# Patient Record
Sex: Male | Born: 2005 | Race: White | Hispanic: No | Marital: Single | Smoking: Never smoker
Health system: Southern US, Community
[De-identification: ages and names within clinical notes are randomized; demographics above are authoritative.]

## PROBLEM LIST (undated history)

## (undated) HISTORY — PX: OTHER SURGICAL HISTORY: SHX169

## (undated) HISTORY — PX: TONSILLECTOMY: SUR1361

---
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
## 2023-01-16 ENCOUNTER — Ambulatory Visit (INDEPENDENT_AMBULATORY_CARE_PROVIDER_SITE_OTHER): Payer: 59 | Admitting: Radiology

## 2023-01-16 ENCOUNTER — Ambulatory Visit
Admission: RE | Admit: 2023-01-16 | Discharge: 2023-01-16 | Disposition: A | Discharge status: Home / Self Care | Payer: 59 | Source: Ambulatory Visit | Attending: Internal Medicine | Admitting: Internal Medicine

## 2023-01-16 VITALS — BP 119/49 | HR 104 | Temp 100.1°F | Resp 17

## 2023-01-16 DIAGNOSIS — R509 Fever, unspecified: Secondary | ICD-10-CM

## 2023-01-16 DIAGNOSIS — R052 Subacute cough: Secondary | ICD-10-CM

## 2023-01-16 DIAGNOSIS — R112 Nausea with vomiting, unspecified: Secondary | ICD-10-CM | POA: Diagnosis not present

## 2023-01-16 DIAGNOSIS — J101 Influenza due to other identified influenza virus with other respiratory manifestations: Secondary | ICD-10-CM

## 2023-01-16 LAB — POCT INFLUENZA A/B
Influenza A, POC: POSITIVE — AB
Influenza B, POC: NEGATIVE

## 2023-01-16 MED ORDER — GUAIFENESIN ER 1200 MG PO TB12: 1200.0000 mg | ORAL_TABLET | Freq: Two times a day (BID) | ORAL | 0 refills | Status: AC

## 2023-01-16 MED ORDER — ONDANSETRON 4 MG PO TBDP: 4.0000 mg | ORAL_TABLET | Freq: Three times a day (TID) | ORAL | 0 refills | Status: AC | PRN

## 2023-01-16 MED ORDER — PROMETHAZINE-DM 6.25-15 MG/5ML PO SYRP: 5.0000 mL | ORAL_SOLUTION | Freq: Every evening | ORAL | 0 refills | Status: AC | PRN

## 2023-01-16 MED ORDER — KETOROLAC TROMETHAMINE 30 MG/ML IJ SOLN
30.0000 mg | Freq: Once | INTRAMUSCULAR | Status: AC
  Administered 2023-01-16: 30 mg via INTRAMUSCULAR

## 2023-01-16 MED ORDER — ONDANSETRON 4 MG PO TBDP
4.0000 mg | ORAL_TABLET | Freq: Once | ORAL | Status: AC
  Administered 2023-01-16: 4 mg via ORAL

## 2023-01-16 MED ORDER — OSELTAMIVIR PHOSPHATE 75 MG PO CAPS: 75.0000 mg | ORAL_CAPSULE | Freq: Two times a day (BID) | ORAL | 0 refills | Status: AC

## 2023-01-16 MED ORDER — ACETAMINOPHEN 325 MG PO TABS
975.0000 mg | ORAL_TABLET | Freq: Once | ORAL | Status: AC
  Administered 2023-01-16: 975 mg via ORAL

## 2023-01-16 NOTE — ED Triage Notes (Addendum)
 Pt c/o fever, cough, vomiting, and congestion. States he was initially sick around thanksgiving and given a z pac. He got somewhat better but symptoms began to feel worse.   States he threw up 13 times last night. He has lost 40lbs in last few months due to vomiting.

## 2023-01-16 NOTE — ED Provider Notes (Signed)
 [facility]    CSN: [id number] Arrival date & time: 01/16/23  0815      History   Chief Complaint Chief Complaint  Patient presents with   Fever    Entered by patient    HPI Raymond Mccoy is a 18 y.o. male.   Raymond Mccoy is a 18 y.o. male presenting for chief complaint of cough, fever, chills, body aches, and nasal congestion that started yesterday January 15, 2023.  Max temp at home was 104 last night.  Additionally reports nausea with multiple episodes of nonbilious/nonbloody emesis starting yesterday and into last night.  Currently nauseous.  He goes to college at [employer] and states multiple classmates have been sick with similar symptoms.  No history of chronic respiratory problems.  Vapes nicotine substance daily, otherwise no drug use.  He took Tylenol  yesterday but has not had any antipyretics or antiemetics this morning and is currently with low-grade fever at 100.1.   Of note, patient became ill 6 weeks ago with cough, congestion, and persistent fever.  He was treated with azithromycin  antibiotic after 1 to 2 weeks of symptoms and persistent fevers.  He states his symptoms improved after azithromycin  but never fully went away.  He developed shortness of breath yesterday along with cough.  No chest tightness, leg swelling, orthopnea, dizziness, ear pain, headaches.  No recent steroids or antibiotics.  Chest x-ray was not performed when he went to urgent care for the antibiotic 2 to 3 weeks ago.   Fever   History reviewed. No pertinent past medical history.  There are no active problems to display for this patient.   Past Surgical History:  Procedure Laterality Date   eustacian tubes     TONSILLECTOMY         Home Medications    Prior to Admission medications   Medication Sig Start Date End Date Taking? Authorizing Provider  Guaifenesin  1200 MG TB12 Take 1 tablet (1,200 mg total) by mouth in the morning and at bedtime.  01/16/23  Yes Enedelia Dorna HERO, FNP  ondansetron  (ZOFRAN -ODT) 4 MG disintegrating tablet Take 1 tablet (4 mg total) by mouth every 8 (eight) hours as needed for nausea or vomiting. 01/16/23  Yes Enedelia Dorna HERO, FNP  oseltamivir  (TAMIFLU ) 75 MG capsule Take 1 capsule (75 mg total) by mouth every 12 (twelve) hours. 01/16/23  Yes Enedelia Dorna HERO, FNP  promethazine -dextromethorphan (PROMETHAZINE -DM) 6.25-15 MG/5ML syrup Take 5 mLs by mouth at bedtime as needed for cough. 01/16/23  Yes Enedelia Dorna HERO, FNP  fexofenadine-pseudoephedrine (ALLEGRA-D) 60-120 MG 12 hr tablet Take 1 tablet by mouth 2 (two) times daily.    [provider]    Family History History reviewed. No pertinent family history.  Social History Social History   Tobacco Use   Smoking status: Never   Smokeless tobacco: Never  Vaping Use   Vaping status: Former  Substance Use Topics   Alcohol use: No   Drug use: No     Allergies   Patient has no known allergies.   Review of Systems Review of Systems  Constitutional:  Positive for fever.  Per HPI   Physical Exam Triage Vital Signs ED Triage Vitals [01/16/23 0838]  Encounter Vitals Group     BP (!) 119/49     Systolic BP Percentile      Diastolic BP Percentile      Pulse Rate (!) 104     Resp 17  Temp 100.1 F (37.8 C)     Temp Source Oral     SpO2 97 %     Weight      Height      Head Circumference      Peak Flow      Pain Score 8     Pain Loc      Pain Education      Exclude from Growth Chart    No data found.  Updated Vital Signs BP (!) 119/49 (BP Location: Right Arm)   Pulse (!) 104   Temp 100.1 F (37.8 C) (Oral)   Resp 17   SpO2 97%   Visual Acuity Right Eye Distance:   Left Eye Distance:   Bilateral Distance:    Right Eye Near:   Left Eye Near:    Bilateral Near:     Physical Exam Vitals and nursing note reviewed.  Constitutional:      Appearance: He is ill-appearing. He is not toxic-appearing.   HENT:     Head: Normocephalic and atraumatic.     Right Ear: Hearing, tympanic membrane, ear canal and external ear normal.     Left Ear: Hearing, tympanic membrane, ear canal and external ear normal.     Nose: Congestion present.     Mouth/Throat:     Lips: Pink.     Mouth: Mucous membranes are moist. No injury or oral lesions.     Dentition: Normal dentition.     Tongue: No lesions.     Pharynx: Oropharynx is clear. Uvula midline. Posterior oropharyngeal erythema present. No pharyngeal swelling, oropharyngeal exudate, uvula swelling or postnasal drip.     Tonsils: No tonsillar exudate.  Eyes:     General: Lids are normal. Vision grossly intact. Gaze aligned appropriately.     Extraocular Movements: Extraocular movements intact.     Conjunctiva/sclera: Conjunctivae normal.  Neck:     Trachea: Trachea and phonation normal.  Cardiovascular:     Rate and Rhythm: Normal rate and regular rhythm.     Heart sounds: Normal heart sounds, S1 normal and S2 normal.  Pulmonary:     Effort: Pulmonary effort is normal. No respiratory distress.     Breath sounds: Normal air entry. Wheezing present. No rhonchi or rales.     Comments: Faint low pitched expiratory heard to the right lower lung field.  Otherwise normal breath sounds throughout.  Harsh productive cough elicited with deep inspiration.  Speaking in full sentences without difficulty. Chest:     Chest wall: No tenderness.  Musculoskeletal:     Cervical back: Neck supple.  Lymphadenopathy:     Cervical: No cervical adenopathy.  Skin:    General: Skin is warm and dry.     Capillary Refill: Capillary refill takes less than 2 seconds.     Findings: No rash.  Neurological:     General: No focal deficit present.     Mental Status: He is alert and oriented to person, place, and time. Mental status is at baseline.     Cranial Nerves: No dysarthria or facial asymmetry.  Psychiatric:        Mood and Affect: Mood normal.        Speech:  Speech normal.        Behavior: Behavior normal.        Thought Content: Thought content normal.        Judgment: Judgment normal.      UC Treatments / Results  Labs (all labs ordered  are listed, but only abnormal results are displayed) Labs Reviewed  POCT INFLUENZA A/B - Abnormal; Notable for the following components:      Result Value   Influenza A, POC Positive (*)    All other components within normal limits    EKG   Radiology DG Chest 2 View Result Date: 01/16/2023 CLINICAL DATA:  persistent cough for 2-3 weeks, fever, shortness of breath EXAM: CHEST - 2 VIEW COMPARISON:  03/02/2016 FINDINGS: The heart size and mediastinal contours are within normal limits. Both lungs are clear. The visualized skeletal structures are unremarkable. IMPRESSION: No active cardiopulmonary disease. Electronically Signed   By: CHRISTELLA.  Shick M.D.   On: 01/16/2023 09:35    Procedures Procedures (including critical care time)  Medications Ordered in UC Medications  ondansetron  (ZOFRAN -ODT) disintegrating tablet 4 mg (4 mg Oral Given 01/16/23 0852)  ketorolac  (TORADOL ) 30 MG/ML injection 30 mg (30 mg Intramuscular Given 01/16/23 0934)  acetaminophen  (TYLENOL ) tablet 975 mg (975 mg Oral Given 01/16/23 0933)    Initial Impression / Assessment and Plan / UC Course  I have reviewed the triage vital signs and the nursing notes.  Pertinent labs & imaging results that were available during my care of the patient were reviewed by me and considered in my medical decision making (see chart for details).   1. Fever, nausea and vomiting, subacute cough, influenza A Flu A point of care testing positive. Lungs clear, vitals hemodynamically stable. Chest x-ray due to subacute cough and concern for acute illness versus progressive post-viral bacterial pneumonia. Chest x-ray negative for pneumonia/abnormality.  Interventions in clinic: Zofran  4mg  ODT, ketorolac  30mg  IM, tylenol  PO No NSAIDs for 24 hours after  ketorolac .  Offered antiviral given timing of illness, Tamiflu  sent to pharmacy.  Recommend supportive care for further symptomatic relief as outlined in AVS.  Modes of transmission, quarantine recommendations, and hand hygiene discussed.  Counseled patient on potential for adverse effects with medications prescribed/recommended today, strict ER and return-to-clinic precautions discussed, patient verbalized understanding.    Final Clinical Impressions(s) / UC Diagnoses   Final diagnoses:  Subacute cough  Fever, unspecified  Nausea and vomiting, unspecified vomiting type  Influenza A     Discharge Instructions      You have the flu.  Give Tamiflu  as prescribed every 12 hours for the next 5 days. Zofran  every 8 hours as needed for nausea and vomiting. Tylenol  and ibuprofen  as needed for fever and aches/pains. Promethazine  DM at bedtime as needed for cough. Mucinex  every 12 hours for cough and congestion.  You may go back to school once you have been fever free without tylenol /motrin  for 24 hours.  If you develop any new or worsening symptoms or if your symptoms do not start to improve, please return here or follow-up with your primary care provider. If your symptoms are severe, please go to the emergency room.      ED Prescriptions     Medication Sig Dispense Auth. Provider   promethazine -dextromethorphan (PROMETHAZINE -DM) 6.25-15 MG/5ML syrup Take 5 mLs by mouth at bedtime as needed for cough. 118 mL Enedelia Going M, FNP   Guaifenesin  1200 MG TB12 Take 1 tablet (1,200 mg total) by mouth in the morning and at bedtime. 14 tablet Enedelia Going CHRISTELLA, FNP   oseltamivir  (TAMIFLU ) 75 MG capsule Take 1 capsule (75 mg total) by mouth every 12 (twelve) hours. 10 capsule Enedelia Going CHRISTELLA, FNP   ondansetron  (ZOFRAN -ODT) 4 MG disintegrating tablet Take 1 tablet (4 mg total) by mouth  every 8 (eight) hours as needed for nausea or vomiting. 20 tablet Enedelia Dorna HERO, FNP       PDMP not reviewed this encounter.   Enedelia Dorna HERO, FNP 01/16/23 1027

## 2023-01-16 NOTE — Discharge Instructions (Addendum)
 You have the flu.  Give Tamiflu  as prescribed every 12 hours for the next 5 days. Zofran  every 8 hours as needed for nausea and vomiting. Tylenol  and ibuprofen  as needed for fever and aches/pains. Promethazine  DM at bedtime as needed for cough. Mucinex  every 12 hours for cough and congestion.  You may go back to school once you have been fever free without tylenol /motrin  for 24 hours.  If you develop any new or worsening symptoms or if your symptoms do not start to improve, please return here or follow-up with your primary care provider. If your symptoms are severe, please go to the emergency room.

## 2023-01-18 ENCOUNTER — Ambulatory Visit: Payer: Self-pay

## 2023-01-19 ENCOUNTER — Ambulatory Visit
Admission: RE | Admit: 2023-01-19 | Discharge: 2023-01-19 | Disposition: A | Discharge status: Home / Self Care | Payer: 59 | Source: Ambulatory Visit | Attending: Internal Medicine

## 2023-01-19 VITALS — BP 130/73 | HR 99 | Temp 98.3°F | Resp 16

## 2023-01-19 DIAGNOSIS — H6692 Otitis media, unspecified, left ear: Secondary | ICD-10-CM

## 2023-01-19 DIAGNOSIS — J209 Acute bronchitis, unspecified: Secondary | ICD-10-CM

## 2023-01-19 MED ORDER — ALBUTEROL SULFATE HFA 108 (90 BASE) MCG/ACT IN AERS: 1.0000 | INHALATION_SPRAY | Freq: Four times a day (QID) | RESPIRATORY_TRACT | 0 refills | Status: AC | PRN

## 2023-01-19 MED ORDER — PREDNISONE 20 MG PO TABS: 40.0000 mg | ORAL_TABLET | Freq: Every day | ORAL | 0 refills | Status: AC

## 2023-01-19 MED ORDER — AMOXICILLIN-POT CLAVULANATE 875-125 MG PO TABS: 1.0000 | ORAL_TABLET | Freq: Two times a day (BID) | ORAL | 0 refills | Status: AC

## 2023-01-19 NOTE — ED Triage Notes (Signed)
Pt c/o left ear pain. Pt was her a few days ago and dx with flu a

## 2023-01-19 NOTE — Discharge Instructions (Addendum)
You have bronchitis which is inflammation of the upper airways in your lungs due to a virus. The following medicines will help with your symptoms.   - Take Augmentin antibiotic every 12 hours for the next 7 days to treat left ear infection.  - Take steroid pills sent to pharmacy as directed. Do not take any other NSAID containing medications such as ibuprofen or naproxen/Aleve while taking prednisone. - You may use albuterol inhaler 1 to 2 puffs every 4-6 hours as needed for cough, shortness of breath, and wheezing. - Take cough medicines as needed. - Use mucinex to break up congestion in nose/chest (guaifenesin 600-1,200mg  every 12 hours as needed).   If you develop any new or worsening symptoms or do not improve in the next 2 to 3 days, please return.  If your symptoms are severe, please go to the emergency room. Follow-up with PCP as needed.

## 2023-01-19 NOTE — ED Provider Notes (Signed)
[facility]    CSN: [id number] Arrival date & time: 01/19/23  1015      History   Chief Complaint Chief Complaint  Patient presents with   Follow-up    Follow up from flu diagnosis on Tuesday. Experiencing ear pain. - Entered by patient    HPI Raymond Mccoy is a 18 y.o. male.   Patient presents to urgent care for evaluation of left-sided ear pain that started yesterday.  Patient became sick with cough, congestion, fever, chills, nausea, vomiting, and bodyaches 5 days ago on January 15, 2023.  He was seen at urgent care on January 14 where he tested positive for influenza and was prescribed Tamiflu.  He has been taking Tamiflu, Zofran, ibuprofen, and Tylenol for symptomatic relief.  He is no longer vomiting and reports improvement in cough/body aches.  He developed shortness of breath 2 days ago and started using expired albuterol inhaler with some relief.  Left ear pain started yesterday.  Additionally reports persistent fever up to 101.0 last night, responded well to Tylenol use at home.  Denies chest pain, dizziness, ear drainage, and vision changes.       History reviewed. No pertinent past medical history.  There are no active problems to display for this patient.   Past Surgical History:  Procedure Laterality Date   eustacian tubes     TONSILLECTOMY         Home Medications    Prior to Admission medications   Medication Sig Start Date End Date Taking? Authorizing Provider  albuterol (VENTOLIN HFA) 108 (90 Base) MCG/ACT inhaler Inhale 1-2 puffs into the lungs every 6 (six) hours as needed for wheezing or shortness of breath. 01/19/23  Yes Carlisle Beers, FNP  amoxicillin-clavulanate (AUGMENTIN) 875-125 MG tablet Take 1 tablet by mouth every 12 (twelve) hours. 01/19/23  Yes Carlisle Beers, FNP  predniSONE (DELTASONE) 20 MG tablet Take 2 tablets (40 mg total) by mouth daily with breakfast for 5 days. 01/19/23 01/24/23 Yes Merranda Bolls, Donavan Burnet, FNP  fexofenadine-pseudoephedrine (ALLEGRA-D) 60-120 MG 12 hr tablet Take 1 tablet by mouth 2 (two) times daily.    [provider]  Guaifenesin 1200 MG TB12 Take 1 tablet (1,200 mg total) by mouth in the morning and at bedtime. 01/16/23   Carlisle Beers, FNP  ondansetron (ZOFRAN-ODT) 4 MG disintegrating tablet Take 1 tablet (4 mg total) by mouth every 8 (eight) hours as needed for nausea or vomiting. 01/16/23   Carlisle Beers, FNP  oseltamivir (TAMIFLU) 75 MG capsule Take 1 capsule (75 mg total) by mouth every 12 (twelve) hours. 01/16/23   Carlisle Beers, FNP  promethazine-dextromethorphan (PROMETHAZINE-DM) 6.25-15 MG/5ML syrup Take 5 mLs by mouth at bedtime as needed for cough. 01/16/23   Carlisle Beers, FNP    Family History History reviewed. No pertinent family history.  Social History Social History   Tobacco Use   Smoking status: Never   Smokeless tobacco: Never  Vaping Use   Vaping status: Former  Substance Use Topics   Alcohol use: No   Drug use: No     Allergies   Patient has no known allergies.   Review of Systems Review of Systems Per HPI  Physical Exam Triage Vital Signs ED Triage Vitals  Encounter Vitals Group     BP 01/19/23 1027 130/73     Systolic BP Percentile --      Diastolic BP Percentile --      Pulse Rate 01/19/23  1027 99     Resp 01/19/23 1027 16     Temp 01/19/23 1027 98.3 F (36.8 C)     Temp Source 01/19/23 1027 Oral     SpO2 01/19/23 1027 96 %     Weight --      Height --      Head Circumference --      Peak Flow --      Pain Score 01/19/23 1029 5     Pain Loc --      Pain Education --      Exclude from Growth Chart --    No data found.  Updated Vital Signs BP 130/73 (BP Location: Right Arm)   Pulse 99   Temp 98.3 F (36.8 C) (Oral)   Resp 16   SpO2 96%   Visual Acuity Right Eye Distance:   Left Eye Distance:   Bilateral Distance:    Right Eye Near:   Left Eye Near:    Bilateral  Near:     Physical Exam Vitals and nursing note reviewed.  Constitutional:      Appearance: He is not ill-appearing or toxic-appearing.  HENT:     Head: Normocephalic and atraumatic.     Jaw: There is normal jaw occlusion.     Right Ear: Hearing, ear canal and external ear normal. Tympanic membrane is erythematous. Tympanic membrane is not perforated or bulging.     Left Ear: Hearing and external ear normal. Swelling and tenderness present. Tympanic membrane is erythematous and bulging. Tympanic membrane is not perforated.     Nose: Nose normal.     Mouth/Throat:     Lips: Pink.     Mouth: Mucous membranes are moist. No injury or oral lesions.     Dentition: Normal dentition.     Tongue: No lesions.     Pharynx: Oropharynx is clear. Uvula midline. No pharyngeal swelling, oropharyngeal exudate, posterior oropharyngeal erythema, uvula swelling or postnasal drip.     Tonsils: No tonsillar exudate.  Eyes:     General: Lids are normal. Vision grossly intact. Gaze aligned appropriately.     Extraocular Movements: Extraocular movements intact.     Conjunctiva/sclera: Conjunctivae normal.  Neck:     Trachea: Trachea and phonation normal.  Cardiovascular:     Rate and Rhythm: Normal rate and regular rhythm.     Heart sounds: Normal heart sounds, S1 normal and S2 normal.  Pulmonary:     Effort: Pulmonary effort is normal. No respiratory distress.     Breath sounds: Normal air entry. No stridor. Wheezing (Focal expiratory wheeze heard to the right lower lung field with coarse breath sounds throughout.) present. No rhonchi or rales.     Comments: Speaking in full sentences without difficulty. Chest:     Chest wall: No tenderness.  Musculoskeletal:     Cervical back: Neck supple.  Lymphadenopathy:     Cervical: Cervical adenopathy present.  Skin:    General: Skin is warm and dry.     Capillary Refill: Capillary refill takes less than 2 seconds.     Findings: No rash.  Neurological:      General: No focal deficit present.     Mental Status: He is alert and oriented to person, place, and time. Mental status is at baseline.     Cranial Nerves: No dysarthria or facial asymmetry.  Psychiatric:        Mood and Affect: Mood normal.        Speech: Speech normal.  Behavior: Behavior normal.        Thought Content: Thought content normal.        Judgment: Judgment normal.      UC Treatments / Results  Labs (all labs ordered are listed, but only abnormal results are displayed) Labs Reviewed - No data to display  EKG   Radiology No results found.  Procedures Procedures (including critical care time)  Medications Ordered in UC Medications - No data to display  Initial Impression / Assessment and Plan / UC Course  I have reviewed the triage vital signs and the nursing notes.  Pertinent labs & imaging results that were available during my care of the patient were reviewed by me and considered in my medical decision making (see chart for details).   1.  Left otitis media, acute bronchitis Presentation consistent with acute viral bronchitis. Will treat with steroid, bronchodilator, cough suppressants for symptomatic relief, and expectorants (mucinex) as needed- see AVS.   Patient non-toxic in appearance, vital signs hemodynamically stable, no new oxygen requirement, therefore deferred imaging of the chest. Low suspicion for acute cardiopulmonary abnormality based on history and PE.  Left AOM will be treated with Augmentin twice daily for 7 days given recent antibiotic use within the last 90 days.  Counseled patient on potential for adverse effects with medications prescribed/recommended today, strict ER and return-to-clinic precautions discussed, patient verbalized understanding.    Final Clinical Impressions(s) / UC Diagnoses   Final diagnoses:  Left otitis media, unspecified otitis media type  Acute bronchitis, unspecified organism     Discharge  Instructions      You have bronchitis which is inflammation of the upper airways in your lungs due to a virus. The following medicines will help with your symptoms.   - Take Augmentin antibiotic every 12 hours for the next 7 days to treat left ear infection.  - Take steroid pills sent to pharmacy as directed. Do not take any other NSAID containing medications such as ibuprofen or naproxen/Aleve while taking prednisone. - You may use albuterol inhaler 1 to 2 puffs every 4-6 hours as needed for cough, shortness of breath, and wheezing. - Take cough medicines as needed. - Use mucinex to break up congestion in nose/chest (guaifenesin 600-1,200mg  every 12 hours as needed).   If you develop any new or worsening symptoms or do not improve in the next 2 to 3 days, please return.  If your symptoms are severe, please go to the emergency room. Follow-up with PCP as needed.    ED Prescriptions     Medication Sig Dispense Auth. Provider   predniSONE (DELTASONE) 20 MG tablet Take 2 tablets (40 mg total) by mouth daily with breakfast for 5 days. 10 tablet Carlisle Beers, FNP   amoxicillin-clavulanate (AUGMENTIN) 875-125 MG tablet Take 1 tablet by mouth every 12 (twelve) hours. 14 tablet Carlisle Beers, FNP   albuterol (VENTOLIN HFA) 108 (90 Base) MCG/ACT inhaler Inhale 1-2 puffs into the lungs every 6 (six) hours as needed for wheezing or shortness of breath. 8 g Carlisle Beers, FNP      PDMP not reviewed this encounter.   Carlisle Beers, FNP 01/19/23 1051

## 2023-05-16 ENCOUNTER — Emergency Department
Admission: EM | Admit: 2023-05-16 | Discharge: 2023-05-16 | Disposition: A | Discharge status: Home / Self Care | Attending: Emergency Medicine | Admitting: Emergency Medicine

## 2023-05-16 ENCOUNTER — Encounter: Payer: Self-pay | Admitting: Emergency Medicine

## 2023-05-16 ENCOUNTER — Other Ambulatory Visit: Payer: Self-pay

## 2023-05-16 DIAGNOSIS — W44D0XA Magnetic metal object unspecified, entering into or through a natural orifice, initial encounter: Secondary | ICD-10-CM | POA: Insufficient documentation

## 2023-05-16 DIAGNOSIS — Y990 Civilian activity done for income or pay: Secondary | ICD-10-CM | POA: Insufficient documentation

## 2023-05-16 DIAGNOSIS — T1502XA Foreign body in cornea, left eye, initial encounter: Secondary | ICD-10-CM | POA: Insufficient documentation

## 2023-05-16 DIAGNOSIS — S00259A Superficial foreign body of unspecified eyelid and periocular area, initial encounter: Secondary | ICD-10-CM

## 2023-05-16 DIAGNOSIS — S0592XA Unspecified injury of left eye and orbit, initial encounter: Secondary | ICD-10-CM | POA: Diagnosis present

## 2023-05-16 MED ORDER — TETRACAINE HCL 0.5 % OP SOLN
2.0000 [drp] | Freq: Once | OPHTHALMIC | Status: AC
  Administered 2023-05-16: 2 [drp] via OPHTHALMIC
  Filled 2023-05-16: qty 4

## 2023-05-16 MED ORDER — IBUPROFEN 600 MG PO TABS
600.0000 mg | ORAL_TABLET | Freq: Once | ORAL | Status: AC
  Administered 2023-05-16: 600 mg via ORAL
  Filled 2023-05-16: qty 1

## 2023-05-16 MED ORDER — FLUORESCEIN SODIUM 1 MG OP STRP
1.0000 | ORAL_STRIP | Freq: Once | OPHTHALMIC | Status: AC
  Administered 2023-05-16: 1 via OPHTHALMIC
  Filled 2023-05-16: qty 1

## 2023-05-16 MED ORDER — HYDROCODONE-ACETAMINOPHEN 5-325 MG PO TABS
1.0000 | ORAL_TABLET | Freq: Once | ORAL | Status: AC
  Administered 2023-05-16: 1 via ORAL
  Filled 2023-05-16: qty 1

## 2023-05-16 NOTE — ED Provider Notes (Signed)
 [facility] Provider Note    Event Date/Time   First MD Initiated Contact with Patient 05/16/23 [phone]     (approximate)   History   Foreign Body in Eye   HPI  Raymond Mccoy is a 18 y.o. male who presents to the ED from home with his mother with a chief complaint of foreign body in left eye.  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  He was wearing eye protection at work around 11 AM when he felt something going into his left eye.  Presents with pain and irritation to his left eye.  Does not wear corrective lenses.     Past Medical History  History reviewed. No pertinent past medical history.   Active Problem List  There are no active problems to display for this patient.    Past Surgical History   Past Surgical History:  Procedure Laterality Date   eustacian tubes     TONSILLECTOMY       Home Medications   Prior to Admission medications   Medication Sig Start Date End Date Taking? Authorizing Provider  albuterol  (VENTOLIN  HFA) 108 (90 Base) MCG/ACT inhaler Inhale 1-2 puffs into the lungs every 6 (six) hours as needed for wheezing or shortness of breath. 01/19/23   Starlene Eaton, FNP  amoxicillin -clavulanate (AUGMENTIN ) 875-125 MG tablet Take 1 tablet by mouth every 12 (twelve) hours. 01/19/23   Starlene Eaton, FNP  fexofenadine-pseudoephedrine (ALLEGRA-D) 60-120 MG 12 hr tablet Take 1 tablet by mouth 2 (two) times daily.    [provider]  Guaifenesin  1200 MG TB12 Take 1 tablet (1,200 mg total) by mouth in the morning and at bedtime. 01/16/23   Starlene Eaton, FNP  ondansetron  (ZOFRAN -ODT) 4 MG disintegrating tablet Take 1 tablet (4 mg total) by mouth every 8 (eight) hours as needed for nausea or vomiting. 01/16/23   Starlene Eaton, FNP  oseltamivir  (TAMIFLU ) 75 MG capsule Take 1 capsule (75 mg total) by mouth every 12 (twelve) hours. 01/16/23   Starlene Eaton, FNP   promethazine -dextromethorphan (PROMETHAZINE -DM) 6.25-15 MG/5ML syrup Take 5 mLs by mouth at bedtime as needed for cough. 01/16/23   Starlene Eaton, FNP     Allergies  Patient has no known allergies.   Family History  History reviewed. No pertinent family history.   Physical Exam  Triage Vital Signs: ED Triage Vitals  Encounter Vitals Group     BP 05/16/23 0052 124/78     Systolic BP Percentile --      Diastolic BP Percentile --      Pulse Rate 05/16/23 0052 76     Resp 05/16/23 0052 18     Temp 05/16/23 0052 98.2 F (36.8 C)     Temp Source 05/16/23 0052 Oral     SpO2 05/16/23 0052 99 %     Weight 05/16/23 0050 170 lb (77.1 kg)     Height 05/16/23 0050 6' (1.829 m)     Head Circumference --      Peak Flow --      Pain Score 05/16/23 0050 6     Pain Loc --      Pain Education --      Exclude from Growth Chart --     Updated Vital Signs: BP 122/83 (BP Location: Left Arm)   Pulse 94   Temp 98.2 F (36.8 C) (Oral)   Resp 18   Ht 6' (1.829 m)  Wt 77.1 kg   SpO2 99%   BMI 23.06 kg/m    General: Awake, no distress.  CV:  Good peripheral perfusion.  Resp:  Normal effort.  Abd:  No distention.  Other:  VA: OD 20/40 OS 20/30 left conjunctiva injected.  Tiny black speck noted to upper left quadrant adjacent to cornea.  Tetracaine drops applied and left eye stained with fluorescein strip and examined under Woods lamp: No COA.   ED Results / Procedures / Treatments  Labs (all labs ordered are listed, but only abnormal results are displayed) Labs Reviewed - No data to display   EKG  None   RADIOLOGY None   Official radiology report(s): No results found.   PROCEDURES:  Critical Care performed: No  Procedures   MEDICATIONS ORDERED IN ED: Medications  fluorescein ophthalmic strip 1 strip (1 strip Left Eye Given 05/16/23 0114)  tetracaine (PONTOCAINE) 0.5 % ophthalmic solution 2 drop (2 drops Left Eye Given 05/16/23 0115)   HYDROcodone-acetaminophen  (NORCO/VICODIN) 5-325 MG per tablet 1 tablet (1 tablet Oral Given 05/16/23 0138)  ibuprofen (ADVIL) tablet 600 mg (600 mg Oral Given 05/16/23 0138)     IMPRESSION / MDM / ASSESSMENT AND PLAN / ED COURSE  I reviewed the triage vital signs and the nursing notes.                             18 year old male presenting with metal to left eye.  Metal piece is a tiny speck, flattened against the cornea, unable to be safely removed in the emergency department.  Will refer to ophthalmology today for removal.  Patient stepfather works in IT at [employer] [facility]; mother will also reach out there and patient will be seen wherever he can be seen the soonest.  Strict return precautions given.  Patient and mother verbalized understanding agree with plan of care.  Patient's presentation is most consistent with acute, uncomplicated illness.    FINAL CLINICAL IMPRESSION(S) / ED DIAGNOSES   Final diagnoses:  Metal foreign body in eye region     Rx / DC Orders   ED Discharge Orders     None        Note:  This document was prepared using Dragon voice recognition software and may include unintentional dictation errors.   Rylei Codispoti J, MD 05/16/23 [phone]

## 2023-05-16 NOTE — ED Triage Notes (Addendum)
 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

## 2023-05-16 NOTE — ED Notes (Signed)
 This tech preformed visual acuity on pt. Pt results were 20/40 on the right eye and 20/30 on the left eye which is the injured eye.

## 2023-05-16 NOTE — Discharge Instructions (Signed)
 Please call the eye clinic either at [facility] or the number above to make an appointment today for foreign body removal from your left eye.  You may take Tylenol  and or Ibuprofen as needed for discomfort.  Return to the ER for worsening symptoms or other concerns.

## 2023-12-25 ENCOUNTER — Ambulatory Visit: Payer: Self-pay
